# Patient Record
Sex: Male | Born: 1985 | Hispanic: Yes | Marital: Single | Smoking: Current every day smoker
Health system: Southern US, Community
[De-identification: ages and names within clinical notes are randomized; demographics above are authoritative.]

## PROBLEM LIST (undated history)

## (undated) DIAGNOSIS — F1910 Other psychoactive substance abuse, uncomplicated: Secondary | ICD-10-CM

---

## 2014-04-17 ENCOUNTER — Emergency Department (HOSPITAL_COMMUNITY)
Admission: EM | Admit: 2014-04-17 | Discharge: 2014-04-17 | Disposition: A | Discharge status: Home / Self Care | Payer: Self-pay | Attending: Emergency Medicine | Admitting: Emergency Medicine

## 2014-04-17 ENCOUNTER — Encounter (HOSPITAL_COMMUNITY): Payer: Self-pay | Admitting: *Deleted

## 2014-04-17 DIAGNOSIS — J3489 Other specified disorders of nose and nasal sinuses: Secondary | ICD-10-CM | POA: Insufficient documentation

## 2014-04-17 DIAGNOSIS — Z720 Tobacco use: Secondary | ICD-10-CM | POA: Insufficient documentation

## 2014-04-17 DIAGNOSIS — H16133 Photokeratitis, bilateral: Secondary | ICD-10-CM | POA: Insufficient documentation

## 2014-04-17 MED ORDER — NAPROXEN 500 MG PO TABS: 500.0000 mg | ORAL_TABLET | Freq: Two times a day (BID) | ORAL | Status: AC

## 2014-04-17 MED ORDER — FLUORESCEIN SODIUM 1 MG OP STRP
1.0000 | ORAL_STRIP | Freq: Once | OPHTHALMIC | Status: AC
  Administered 2014-04-17: 1 via OPHTHALMIC
  Filled 2014-04-17: qty 1

## 2014-04-17 MED ORDER — HYDROCODONE-ACETAMINOPHEN 5-325 MG PO TABS: 1.0000 | ORAL_TABLET | ORAL | Status: AC | PRN

## 2014-04-17 MED ORDER — TETRACAINE HCL 0.5 % OP SOLN
1.0000 [drp] | Freq: Once | OPHTHALMIC | Status: AC
  Administered 2014-04-17: 1 [drp] via OPHTHALMIC
  Filled 2014-04-17: qty 2

## 2014-04-17 MED ORDER — ERYTHROMYCIN 5 MG/GM OP OINT: 1.0000 "application " | TOPICAL_OINTMENT | Freq: Four times a day (QID) | OPHTHALMIC | Status: DC

## 2014-04-17 NOTE — Discharge Instructions (Signed)
Ultraviolet Keratitis  Ultraviolet keratitis can occur when too much UV light enters the cornea. The cornea is the clear cover on the front part of your eye that helps focus light. It protects your eyes from dust and other foreign bodies and filters ultraviolet rays. This condition can be caused by exposure to snow (snow blindness) from the reflected or direct sunlight. It may also be caused by exposure to welding arcs or halogen lamps (flash burn) and prolonged exposure to direct sunlight. Brief exposure can result in severe problems several hours later.  CAUSES   Causes of ultraviolet keratitis include:   Exposure to snow (snow blindness) from the reflected or direct sunlight.   Exposure to welding arcs or halogen lamps (flash burn).   Prolonged exposure to direct sunlight.  SYMPTOMS   The symptoms of ultraviolet keratitis usually start 6 to 12 hours after the damage occurred. They may include the following:    Tearing.   Light sensitivity.   Gritty sensation in eyes.   Swelling of your eyelids.   Severe pain.  In spite of the pain, this condition will usually improve within 24 hours even without treatment.  HOME CARE INSTRUCTIONS    Apply cold packs on your eyes to ease the pain.   Only take over-the-counter or prescription medicines for pain, discomfort, or fever as directed by your caregiver.   Your caregiver may also prescribe an antibiotic ointment to help prevent infection and/or additional medications for pain relief.   Apply an eye patch to help relieve discomfort and assist in healing. If your caregiver patches your eyes, it is important to leave these patches on.   Follow the instructions given to you by your caregiver.   Do not rub your eyes.   If your caregiver has given you a follow-up appointment, it is very important to keep that appointment. Not keeping the appointment could result in a severe eye infection or permanent loss of vision. If there is any problem keeping the appointment,  you must call back to this facility for assistance.  SEEK IMMEDIATE MEDICAL CARE IF:    Pain is severe and not relieved by medication.   Pain or problems with vision last more than 48 hours.   Exposure to light is unavoidable and you need extra protection for your eyes.  MAKE SURE YOU:    Understand these instructions.   Will watch your condition.   Will get help right away if you are not doing well or get worse.  Document Released: 05/24/2005 Document Revised: 10/08/2013 Document Reviewed: 12/29/2007  [employer] Patient Information 2015 [employer]. This information is not intended to replace advice given to you by your health care provider. Make sure you discuss any questions you have with your health care provider.

## 2014-04-17 NOTE — ED Notes (Signed)
The pt is c/o pain and tearing from both eyes.  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  His pain started 2 hours ago.  Light sensitive

## 2014-04-17 NOTE — ED Provider Notes (Signed)
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 He states that he developed bilateral eye pain with redness and tearing soon afterwards. This has been persistent through the day. On exam the patient has bloodshot watery eyes, no discharge, no foreign bodies, under fluorescein and tetracaine exam there is diffuse mild stippling of both cornea consistent with UV keratitis, patient will be given antibiotics, pain medications, referred to ophthalmology as outpatient. The patient agrees with and understands his discharge diagnosis and instructions for follow-up.  Meds given in ED:  Medications  tetracaine (PONTOCAINE) 0.5 % ophthalmic solution 1 drop (1 drop Both Eyes Given 04/17/14 2332)  fluorescein ophthalmic strip 1 strip (1 strip Both Eyes Given 04/17/14 2333)    New Prescriptions   ERYTHROMYCIN OPHTHALMIC OINTMENT    Place 1 application into both eyes every 6 (six) hours. Place 1/2 inch ribbon of ointment in the affected eye 4 times a day   HYDROCODONE-ACETAMINOPHEN (NORCO/VICODIN) 5-325 MG PER TABLET    Take 1 tablet by mouth every 4 (four) hours as needed for moderate pain or severe pain.   NAPROXEN (NAPROSYN) 500 MG TABLET    Take 1 tablet (500 mg total) by mouth 2 (two) times daily with a meal.    Medical screening examination/treatment/procedure(s) were conducted as a shared visit with non-physician practitioner(s) and myself.  I personally evaluated the patient during the encounter.  Clinical Impression:   Final diagnoses:  UV keratitis, bilateral         Vida RollerBrian D Jola Critzer, MD 04/17/14 [phone]

## 2014-04-18 NOTE — ED Provider Notes (Signed)
CSN: [id number]     Arrival date & time 04/17/14  2252 History   First MD Initiated Contact with Patient 04/17/14 2258     Chief Complaint  Patient presents with  . Eye Pain   Jeremy Blankenship is a 28 y.o. 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 His pain is 9 out of 10 and worse with light. He denies any eye foreign body. He does not wear contacts or glasses. Is no history of eye problems. He is attempted no treatments today. He denies eye discharge, trauma to his eye, fevers, chills, nasal congestion, or headache.  (Consider location/radiation/quality/duration/timing/severity/associated sxs/prior Treatment) Patient is a 28 y.o. male presenting with eye pain. The history is provided by the patient.  Eye Pain This is a new problem. The current episode started today. The problem occurs constantly. The problem has been gradually worsening. Pertinent negatives include no abdominal pain, arthralgias, change in bowel habit, chest pain, chills, congestion, coughing, diaphoresis, fatigue, fever, headaches, myalgias, nausea, neck pain, numbness, rash, sore throat, swollen glands, urinary symptoms, vertigo, vomiting or weakness.    History reviewed. No pertinent past medical history. History reviewed. No pertinent past surgical history. No family history on file. History  Substance Use Topics  . Smoking status: Current Every Day Smoker  . Smokeless tobacco: Not on file  . Alcohol Use: No    Review of Systems  Constitutional: Negative for fever, chills, diaphoresis and fatigue.  HENT: Positive for rhinorrhea. Negative for congestion, hearing loss, sneezing, sore throat and trouble swallowing.   Eyes: Positive for  photophobia, pain, redness and itching. Negative for discharge.  Respiratory: Negative for cough and wheezing.   Cardiovascular: Negative for chest pain.  Gastrointestinal: Negative for nausea, vomiting, abdominal pain and change in bowel habit.  Musculoskeletal: Negative for myalgias, arthralgias and neck pain.  Skin: Negative for rash.  Neurological: Negative for vertigo, syncope, weakness, light-headedness, numbness and headaches.  All other systems reviewed and are negative.     Allergies  Review of patient's allergies indicates no known allergies.  Home Medications   Prior to Admission medications   Medication Sig Start Date End Date Taking? Authorizing Provider  erythromycin ophthalmic ointment Place 1 application into both eyes every 6 (six) hours. Place 1/2 inch ribbon of ointment in the affected eye 4 times a day 04/17/14   Lawana ChambersWilliam Duncan Marlette Curvin, PA  HYDROcodone-acetaminophen (NORCO/VICODIN) 5-325 MG per tablet Take 1 tablet by mouth every 4 (four) hours as needed for moderate pain or severe pain. 04/17/14   Lawana ChambersWilliam Duncan Jamilia Jacques, PA  naproxen (NAPROSYN) 500 MG tablet Take 1 tablet (500 mg total) by mouth 2 (two) times daily with a meal. 04/17/14   Einar GipWilliam Duncan Natika Geyer, PA   BP 124/77 mmHg  Pulse 89  Temp(Src) 98.4 F (36.9 C)  Resp 18  Ht 5\' 10"  (1.778 m)  Wt 150 lb (68.04 kg)  BMI 21.52 kg/m2  SpO2 100% Physical Exam  Constitutional: He appears well-developed and well-nourished. No distress.  HENT:  Head: Normocephalic and atraumatic.  Right Ear: External ear normal.  Left Ear: External ear normal.  Nose: Nose normal.  Mouth/Throat: Oropharynx is clear and moist. No oropharyngeal exudate.  Bilateral TMs pearly gray with no erythema or  loss of landmarks. No temporal tenderness.  Eyes: EOM are normal. Pupils are equal, round, and reactive to light. Right eye exhibits no discharge. No foreign body present in the right eye. Left eye exhibits no discharge. No foreign  body present in the left eye. Right conjunctiva is injected. Right conjunctiva has no hemorrhage. Left conjunctiva is injected. Left conjunctiva has no hemorrhage. No scleral icterus. Right eye exhibits normal extraocular motion. Left eye exhibits normal extraocular motion.  Bilateral eyes anesthetized using tetracaine and stained with fluorescein. Punctate areas of uptake of fluorescein stain seen under ultraviolet light diffusely across bilateral eyes when using slit lamp.  No evidence of foreign body.  Neck: Normal range of motion. Neck supple.  Cardiovascular: Normal rate, regular rhythm, normal heart sounds and intact distal pulses.  Exam reveals no gallop and no friction rub.   No murmur heard. Pulmonary/Chest: Effort normal and breath sounds normal. No respiratory distress.  Abdominal: Soft. There is no tenderness.  Lymphadenopathy:    He has no cervical adenopathy.  Neurological: He is alert. Coordination normal.  Skin: Skin is warm and dry. No rash noted. He is not diaphoretic.  Psychiatric: He has a normal mood and affect. His behavior is normal.  Nursing note and vitals reviewed.   ED Course  Procedures (including critical care time) Labs Review Labs Reviewed - No data to display  Imaging Review No results found.   EKG Interpretation None      Filed Vitals:   04/17/14 2255  BP: 124/77  Pulse: 89  Temp: 98.4 F (36.9 C)  Resp: 18  Height: 5\' 10"  (1.778 m)  Weight: 150 lb (68.04 kg)  SpO2: 100%     MDM   Meds given in ED:  Medications  tetracaine (PONTOCAINE) 0.5 % ophthalmic solution 1 drop (1 drop Both Eyes Given 04/17/14 2332)  fluorescein ophthalmic strip 1 strip (1 strip Both Eyes Given 04/17/14 2333)    Discharge Medication List as of 04/17/2014 11:29 PM    START taking these medications   Details  erythromycin ophthalmic ointment Place 1 application into both eyes every 6 (six) hours. Place 1/2 inch ribbon of ointment in the affected eye 4 times a  day, Starting 04/17/2014, Until Discontinued, Print    HYDROcodone-acetaminophen (NORCO/VICODIN) 5-325 MG per tablet Take 1 tablet by mouth every 4 (four) hours as needed for moderate pain or severe pain., Starting 04/17/2014, Until Discontinued, Print    naproxen (NAPROSYN) 500 MG tablet Take 1 tablet (500 mg total) by mouth 2 (two) times daily with a meal., Starting 04/17/2014, Until Discontinued, Print        Final diagnoses:  UV keratitis, bilateral   Darril Yancey Blankenship is a 28 y.o. 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 Under fluorescein stain there is mild punctate areas of uptake visualized under slit lamp exam. This consistent with UV keratitis. Patient is discharged with antibiotic ointment, with Norco and Naprosyn for pain control. I Advised patient to follow-up with Dr. Randon GoldsmithLyles, ophthalmologist, within 48 hours. I advised patient to always use a face shield when welding. I advised patient to return to the ED with new or worsening symptoms or new concerns. Patient verbalized understanding and agreement with plan.  Patient was discussed with and evaluated by Dr. Eber HongBrian Miller who agrees with assessment and plan.    Lawana ChambersWilliam Duncan Alecxander Mainwaring, PA 04/18/14 [id number]  Vida RollerBrian D Miller, MD 04/19/14 [phone]

## 2014-08-27 ENCOUNTER — Emergency Department (INDEPENDENT_AMBULATORY_CARE_PROVIDER_SITE_OTHER)
Admission: EM | Admit: 2014-08-27 | Discharge: 2014-08-27 | Disposition: A | Discharge status: Home / Self Care | Payer: Self-pay | Source: Home / Self Care | Attending: Family Medicine | Admitting: Family Medicine

## 2014-08-27 ENCOUNTER — Encounter (HOSPITAL_COMMUNITY): Payer: Self-pay | Admitting: Emergency Medicine

## 2014-08-27 DIAGNOSIS — J3489 Other specified disorders of nose and nasal sinuses: Secondary | ICD-10-CM

## 2014-08-27 MED ORDER — MUPIROCIN 2 % EX OINT: 1.0000 "application " | TOPICAL_OINTMENT | Freq: Two times a day (BID) | CUTANEOUS | Status: DC

## 2014-08-27 NOTE — ED Notes (Signed)
Patient c/o nasal irritation with mild bleeding for a couple days. Patient reports his nose feels like its scabbed on the inside and it hurts to breathe through his nose. Patient is in NAD.

## 2014-08-27 NOTE — ED Provider Notes (Signed)
CSN: [id number]     Arrival date & time 08/27/14  1106 History   First MD Initiated Contact with Patient 08/27/14 1252     Chief Complaint  Patient presents with  . Nose Problem   (Consider location/radiation/quality/duration/timing/severity/associated sxs/prior Treatment) HPI       29 year old male presents complaining of nasal irritation and slight bleeding in the nose  From the right nostril. He says that last week he was sick with nasal congestion, cough, and fever. This is mostly got better but now his nose feels very irritated. It has been bleeding some. He has been applying Neosporin but this causes severe burning sensation. Bleeding has been spotty, no perfuse bleeding. No systemic symptoms at this time  History reviewed. No pertinent past medical history. History reviewed. No pertinent past surgical history. No family history on file. History  Substance Use Topics  . Smoking status: Current Every Day Smoker  . Smokeless tobacco: Not on file  . Alcohol Use: No    Review of Systems  HENT: Positive for congestion.        Pain within the right naris  All other systems reviewed and are negative.   Allergies  Review of patient's allergies indicates no known allergies.  Home Medications   Prior to Admission medications   Medication Sig Start Date End Date Taking? Authorizing Provider  erythromycin ophthalmic ointment Place 1 application into both eyes every 6 (six) hours. Place 1/2 inch ribbon of ointment in the affected eye 4 times a day 04/17/14   Everlene Farrier, PA-C  HYDROcodone-acetaminophen (NORCO/VICODIN) 5-325 MG per tablet Take 1 tablet by mouth every 4 (four) hours as needed for moderate pain or severe pain. 04/17/14   Everlene Farrier, PA-C  mupirocin ointment (BACTROBAN) 2 % Place 1 application into the nose 2 (two) times daily. 08/27/14   Graylon Good, PA-C  naproxen (NAPROSYN) 500 MG tablet Take 1 tablet (500 mg total) by mouth 2 (two) times daily with a meal.  04/17/14   Everlene Farrier, PA-C   BP 111/74 mmHg  Pulse 75  Temp(Src) 98.6 F (37 C) (Oral)  Resp 16  SpO2 100% Physical Exam  Constitutional: He is oriented to person, place, and time. He appears well-developed and well-nourished. No distress.  HENT:  Head: Normocephalic.  Nose: Epistaxis is observed.  In the right naris, there is a small area of crusting and irritation with very scant bleeding  Pulmonary/Chest: Effort normal. No respiratory distress.  Neurological: He is alert and oriented to person, place, and time. Coordination normal.  Skin: Skin is warm and dry. No rash noted. He is not diaphoretic.  Psychiatric: He has a normal mood and affect. Judgment normal.  Nursing note and vitals reviewed.   ED Course  Procedures (including critical care time) Labs Review Labs Reviewed - No data to display  Imaging Review No results found.   MDM   1. Nose irritation     there is either an abrasion or this may indicate a superficial infection. Also this may be a reaction to the Neosporin. Stop Neosporin, start mupirocin ointment.  F/u PRN if worsening.     Meds ordered this encounter  Medications  . mupirocin ointment (BACTROBAN) 2 %    Sig: Place 1 application into the nose 2 (two) times daily.    Dispense:  22 g    Refill:  0    Order Specific Question:  Supervising Provider    Answer:  Bradd Canary D [phone]  Graylon GoodZachary H Baraka Klatt, PA-C 08/27/14 1320

## 2017-03-28 ENCOUNTER — Ambulatory Visit (HOSPITAL_COMMUNITY)
Admission: EM | Admit: 2017-03-28 | Discharge: 2017-03-28 | Disposition: A | Discharge status: Home / Self Care | Payer: Self-pay | Attending: Family Medicine | Admitting: Family Medicine

## 2017-03-28 ENCOUNTER — Encounter (HOSPITAL_COMMUNITY): Payer: Self-pay | Admitting: Emergency Medicine

## 2017-03-28 DIAGNOSIS — H1033 Unspecified acute conjunctivitis, bilateral: Secondary | ICD-10-CM

## 2017-03-28 DIAGNOSIS — F1410 Cocaine abuse, uncomplicated: Secondary | ICD-10-CM

## 2017-03-28 HISTORY — DX: Other psychoactive substance abuse, uncomplicated: F19.10

## 2017-03-28 MED ORDER — MUPIROCIN 2 % EX OINT: 1.0000 "application " | TOPICAL_OINTMENT | Freq: Two times a day (BID) | CUTANEOUS | 0 refills | Status: AC

## 2017-03-28 MED ORDER — ERYTHROMYCIN 5 MG/GM OP OINT: 1.0000 "application " | TOPICAL_OINTMENT | Freq: Four times a day (QID) | OPHTHALMIC | 1 refills | Status: AC

## 2017-03-28 NOTE — ED Triage Notes (Signed)
Pt reports bilateral eye drainage and redness x3 days.  Pt states he has significant issues with his nasal passages due to heavy cocaine use.

## 2017-03-30 NOTE — ED Provider Notes (Signed)
  [facility]   [id number] 03/28/17 Arrival Time: 1137  ASSESSMENT & PLAN:  1. Acute conjunctivitis of both eyes, unspecified acute conjunctivitis type   2. Cocaine abuse (HCC)     Meds ordered this encounter  Medications  . erythromycin ophthalmic ointment    Sig: Place 1 application into both eyes every 6 (six) hours. Place 1/2 inch ribbon of ointment in the affected eye 4 times a day    Dispense:  1 g    Refill:  1  . mupirocin ointment (BACTROBAN) 2 %    Sig: Place 1 application into the nose 2 (two) times daily.    Dispense:  22 g    Refill:  0   Rehab information given. If eyes not improving in the next few days he will return. Reviewed expectations re: course of current medical issues. Questions answered. Outlined signs and symptoms indicating need for more acute intervention. Patient verbalized understanding. After Visit Summary given.   SUBJECTIVE:  Jeremy Blankenship is a 31 y.o. male who presents with complaint of bilateral eye irritation and persistent drainage. Fairly abrupt onset a few days ago. Does not wear contact lenses. No OTC treatment. Vision normal. No eye pain reported.  Also has been snorting cocaine for approx 4 years. Desires any information concerning rehab possibilities. Last use yesterday. "Blowing thick chunks out of my nose" for the past 1-2 weeks. No bleeding.  ROS: As per HPI.   OBJECTIVE:  Vitals:   03/28/17 1200  BP: 119/78  Pulse: 95  Temp: 98.4 F (36.9 C)    General appearance: alert; no distress Eyes: PERRLA; EOMI; conjunctiva normal HENT: normocephalic; atraumatic; TMs normal; nasal septum missing, mucosa very irritated and inflammed; oral mucosa normal Neck: supple Skin: warm and dry Psychological: alert and cooperative; normal mood and affect   No Known Allergies  Past Medical History:  Diagnosis Date  . Drug abuse [facility](HCC)    Social History   Social History  . Marital status: Single    Spouse name: N/A  .  Number of children: N/A  . Years of education: N/A   Occupational History  . Not on file.   Social History Main Topics  . Smoking status: Current Every Day Smoker    Packs/day: 0.50    Types: Cigarettes  . Smokeless tobacco: Never Used  . Alcohol use No  . Drug use: Yes    Frequency: 7.0 times per week    Types: Cocaine  . Sexual activity: Not on file   Other Topics Concern  . Not on file   Social History Narrative  . No narrative on file      Mardella LaymanHagler, Chyna Kneece, MD 03/30/17 [phone]

## 2017-10-27 ENCOUNTER — Emergency Department (HOSPITAL_COMMUNITY)
Admission: EM | Admit: 2017-10-27 | Discharge: 2017-10-27 | Disposition: A | Discharge status: Home / Self Care | Payer: Self-pay | Attending: Emergency Medicine | Admitting: Emergency Medicine

## 2017-10-27 ENCOUNTER — Other Ambulatory Visit: Payer: Self-pay

## 2017-10-27 ENCOUNTER — Encounter (HOSPITAL_COMMUNITY): Payer: Self-pay | Admitting: Emergency Medicine

## 2017-10-27 ENCOUNTER — Emergency Department (HOSPITAL_COMMUNITY): Payer: Self-pay

## 2017-10-27 DIAGNOSIS — F17210 Nicotine dependence, cigarettes, uncomplicated: Secondary | ICD-10-CM | POA: Insufficient documentation

## 2017-10-27 DIAGNOSIS — Y9389 Activity, other specified: Secondary | ICD-10-CM | POA: Insufficient documentation

## 2017-10-27 DIAGNOSIS — Y998 Other external cause status: Secondary | ICD-10-CM | POA: Insufficient documentation

## 2017-10-27 DIAGNOSIS — S0181XA Laceration without foreign body of other part of head, initial encounter: Secondary | ICD-10-CM | POA: Insufficient documentation

## 2017-10-27 DIAGNOSIS — Y929 Unspecified place or not applicable: Secondary | ICD-10-CM | POA: Insufficient documentation

## 2017-10-27 DIAGNOSIS — S0990XA Unspecified injury of head, initial encounter: Secondary | ICD-10-CM

## 2017-10-27 DIAGNOSIS — Z23 Encounter for immunization: Secondary | ICD-10-CM | POA: Insufficient documentation

## 2017-10-27 DIAGNOSIS — Z79899 Other long term (current) drug therapy: Secondary | ICD-10-CM | POA: Insufficient documentation

## 2017-10-27 MED ORDER — LIDOCAINE-EPINEPHRINE (PF) 2 %-1:200000 IJ SOLN
20.0000 mL | Freq: Once | INTRAMUSCULAR | Status: AC
  Administered 2017-10-27: 20 mL
  Filled 2017-10-27: qty 20

## 2017-10-27 MED ORDER — TETANUS-DIPHTH-ACELL PERTUSSIS 5-2.5-18.5 LF-MCG/0.5 IM SUSP
0.5000 mL | Freq: Once | INTRAMUSCULAR | Status: AC
  Administered 2017-10-27: 0.5 mL via INTRAMUSCULAR
  Filled 2017-10-27: qty 0.5
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
## 2017-10-27 NOTE — ED Notes (Signed)
Patient transported to CT 

## 2017-10-27 NOTE — Discharge Instructions (Addendum)
Please read attached information. If you experience any new or worsening signs or symptoms please return to the emergency room for evaluation. Please follow-up with your primary care provider or specialist as discussed.  °
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
## 2019-11-04 IMAGING — CT CT CERVICAL SPINE W/O CM
5 of 8 series · 11 of 33 positions shown, 12 images · non-contrast
Comparison: None.

CLINICAL DATA: Salt

EXAM:
CT HEAD WITHOUT CONTRAST
CT CERVICAL SPINE WITHOUT CONTRAST
TECHNIQUE: Multidetector CT imaging of the head and cervical spine was
performed following the standard protocol without intravenous
contrast. Multiplanar CT image reconstructions of the cervical spine
were also generated.

[Series 4: head bone · axial · 0.43mm/px · z∈[-49,+7]mm · 2 of 86 slices shown]
[im 29/86  bone]
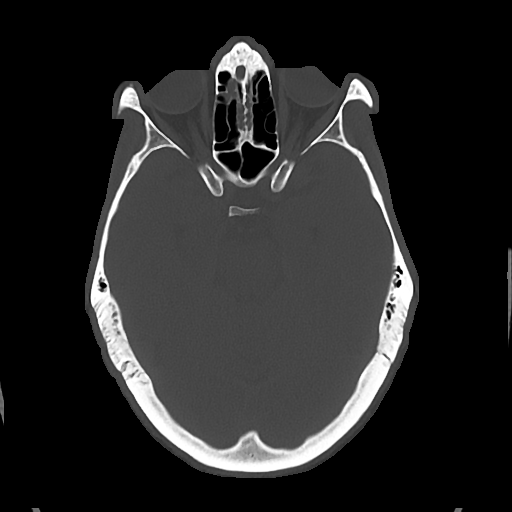
[im 57/86  bone]
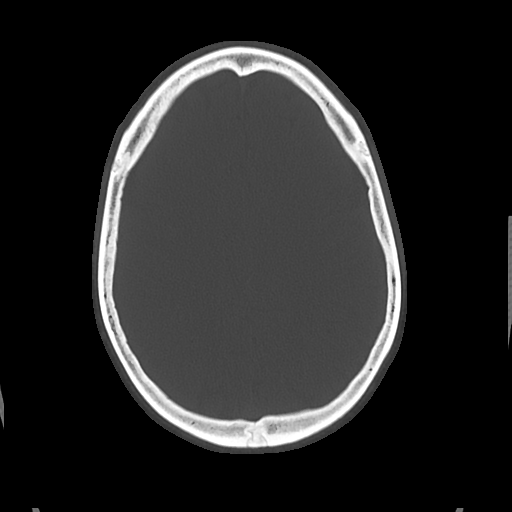

[Series 8: c spine soft · axial · 0.28mm/px · z∈[-202,-136]mm · 2 of 100 slices shown]
[im 34/100  soft-tissue]
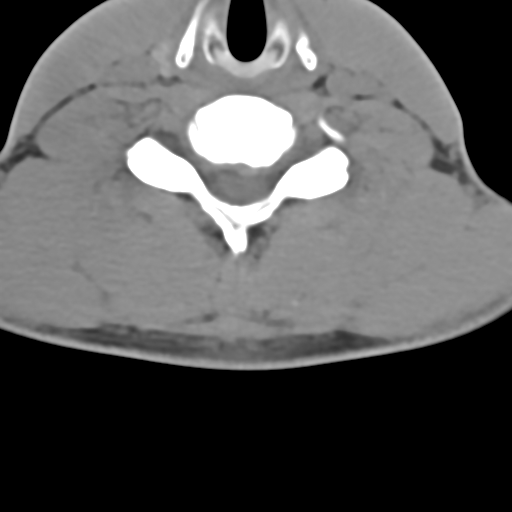
[im 67/100  soft-tissue]
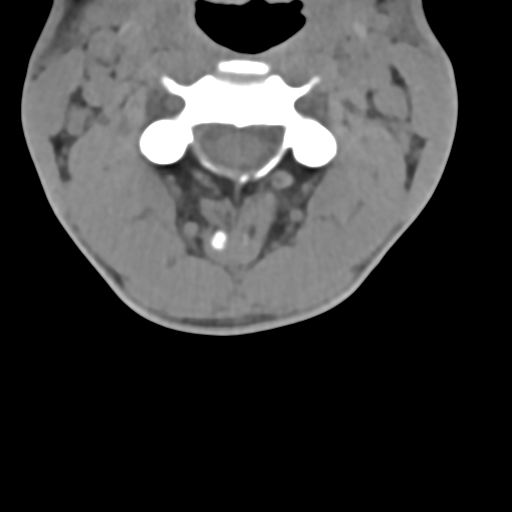

[Series 9: sag bone · sagittal · 0.24mm/px · 4 of 56 slices shown]
[im 12/56  bone]
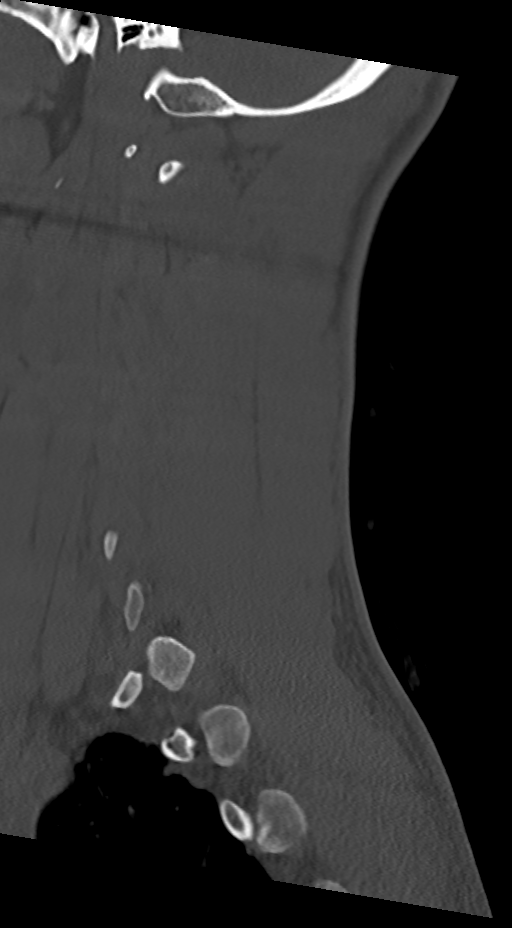
[im 23/56  bone]
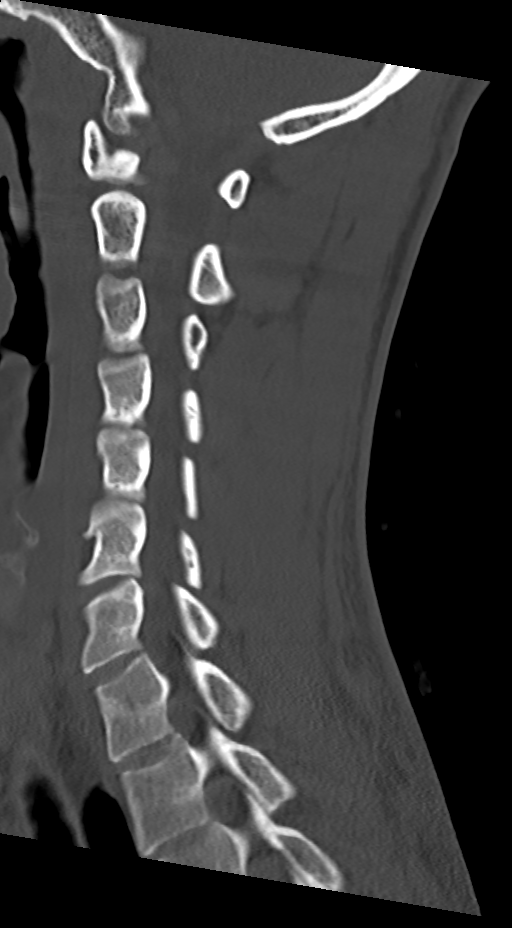
[im 34/56  bone]
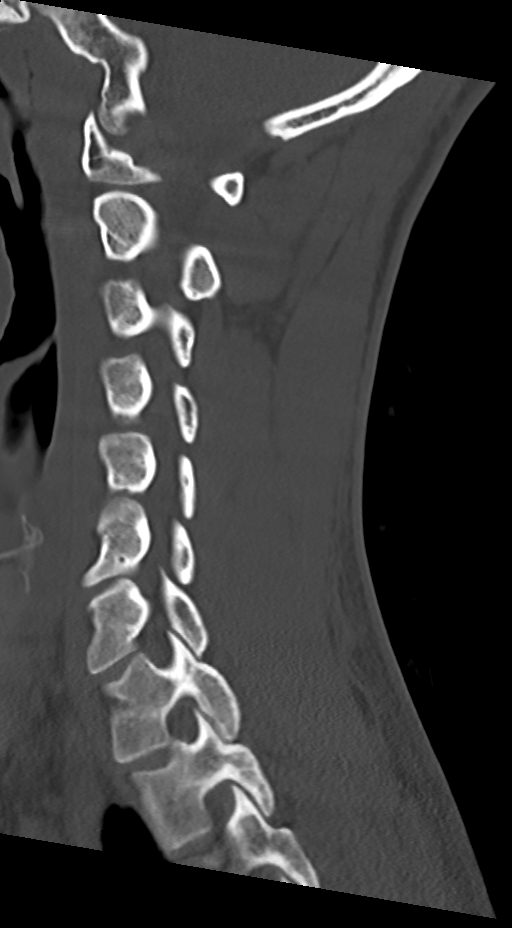
[im 45/56  bone]
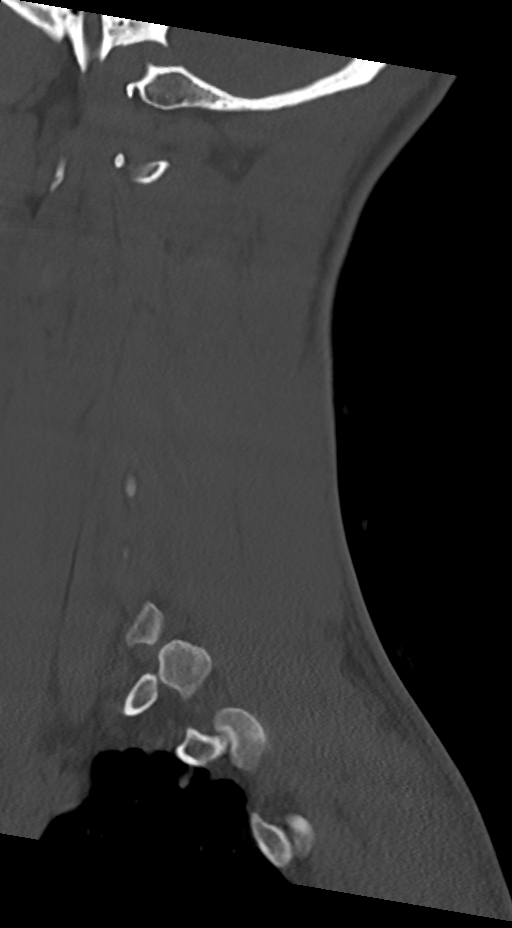

[Series 10: cor bone · coronal · 0.26mm/px · 1 of 64 slices shown]
[im 32/64  bone]
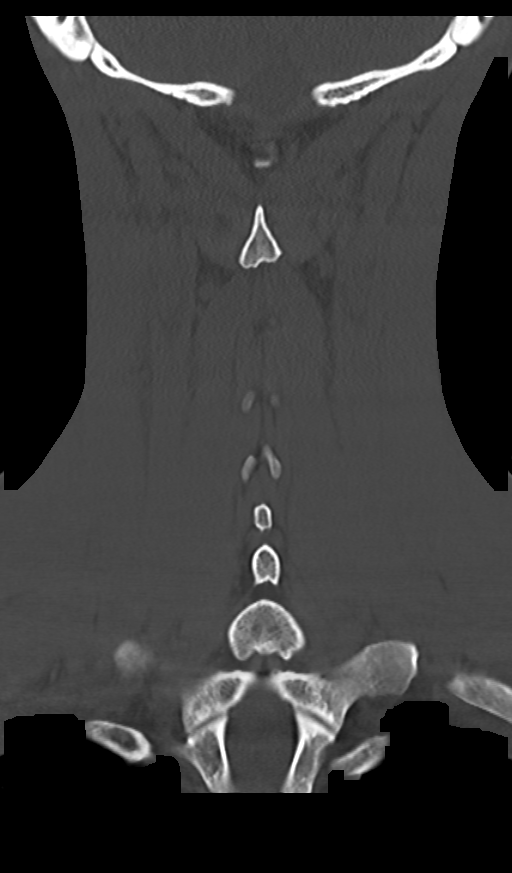

[Series 12: orthogonal axials · axial · 0.21mm/px · z∈[-217,-155]mm · 2 of 98 slices shown, 3 images]
[im 33/98  soft-tissue]
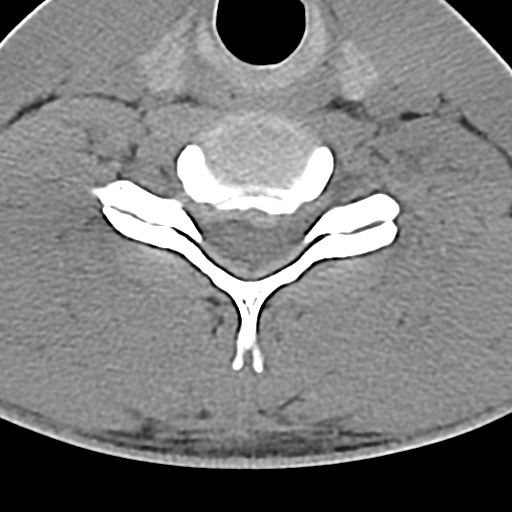
[im 33/98  bone]
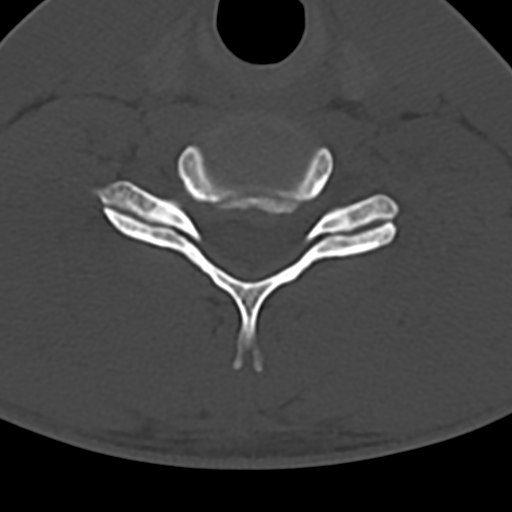
[im 65/98  bone]
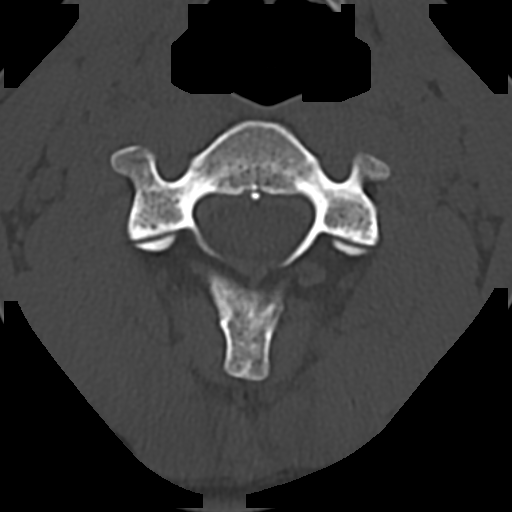

[11 of 33 positions shown; findings below may reference images not displayed]

FINDINGS: CT HEAD FINDINGS

Brain: There is no mass, hemorrhage or extra-axial collection. No
evidence of acute cortical infarct. The size and configuration of
the ventricles and extra-axial CSF spaces are normal. The brain
parenchyma is normal.

Vascular: No hyperdense vessel or unexpected vascular calcification.

Skull: Left frontal scalp hematoma..  No skull fracture.

Sinuses/Orbits: No fluid levels or advanced mucosal thickening of
the visualized paranasal sinuses. No mastoid or middle ear effusion.
The orbits are normal.

CT CERVICAL SPINE FINDINGS

Alignment: No static subluxation. Facets are aligned. Occipital
condyles are normally positioned.

Skull base and vertebrae: No acute fracture.

Soft tissues and spinal canal: No prevertebral fluid or swelling. No
visible canal hematoma.

Disc levels: No advanced spinal canal or neural foraminal stenosis.

Upper chest: No pneumothorax, pulmonary nodule or pleural effusion.

Other: Normal visualized paraspinal cervical soft tissues.
IMPRESSION: 1. No acute intracranial abnormality.
2. Small left frontal scalp hematoma without skull fracture.
3. Normal cervical spine.
